# Patient Record
Sex: Male | Born: 2000 | Race: Black or African American | Hispanic: No | Marital: Single | Smoking: Never smoker
Health system: Southern US, Community
[De-identification: ages and names within clinical notes are randomized; demographics above are authoritative.]

## PROBLEM LIST (undated history)

## (undated) ENCOUNTER — Ambulatory Visit: Admission: EM | Payer: Self-pay | Source: Home / Self Care

## (undated) DIAGNOSIS — J45909 Unspecified asthma, uncomplicated: Secondary | ICD-10-CM

## (undated) HISTORY — PX: WRIST SURGERY: SHX841

---
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
## 2018-02-13 ENCOUNTER — Ambulatory Visit: Admission: EM | Admit: 2018-02-13 | Discharge: 2018-02-13 | Discharge status: Against Medical Advice | Payer: Self-pay

## 2018-04-24 ENCOUNTER — Other Ambulatory Visit: Payer: Self-pay

## 2018-04-24 ENCOUNTER — Ambulatory Visit
Admission: EM | Admit: 2018-04-24 | Discharge: 2018-04-24 | Disposition: A | Discharge status: Home / Self Care | Payer: Self-pay | Attending: Family Medicine | Admitting: Family Medicine

## 2018-04-24 ENCOUNTER — Ambulatory Visit (INDEPENDENT_AMBULATORY_CARE_PROVIDER_SITE_OTHER): Payer: Self-pay

## 2018-04-24 ENCOUNTER — Encounter: Payer: Self-pay | Admitting: Emergency Medicine

## 2018-04-24 DIAGNOSIS — M79675 Pain in left toe(s): Secondary | ICD-10-CM

## 2018-04-24 DIAGNOSIS — L608 Other nail disorders: Secondary | ICD-10-CM

## 2018-04-24 DIAGNOSIS — L03032 Cellulitis of left toe: Secondary | ICD-10-CM

## 2018-04-24 MED ORDER — CEPHALEXIN 500 MG PO CAPS: 500.0000 mg | ORAL_CAPSULE | Freq: Three times a day (TID) | ORAL | 0 refills | Status: AC

## 2018-04-24 MED ORDER — MUPIROCIN 2 % EX OINT: TOPICAL_OINTMENT | CUTANEOUS | 0 refills | Status: DC

## 2018-04-24 NOTE — ED Triage Notes (Signed)
Pt c/o left great toe pain. He was practicing basketball last night and noticed his toe was hurting. He thought he just cracked his toe nail but when he woke up this morning his toe was red and swollen.

## 2018-04-24 NOTE — ED Provider Notes (Signed)
[facility] ____________________________________________  Time seen: Approximately 10:22 AM  I have reviewed the triage vital signs and the nursing notes.   HISTORY  Chief Complaint Toe Pain (left great toe)  HPI Arthur Gates is a 17 y.o. male presenting with mother bedside for evaluation of left great toe pain since yesterday.  Patient states that he had an extended vascular practice yesterday and which afterwards he noticed pain to the left great toe.  Also did notice some bleeding along the medial nail.  Denies any purulence.  States the area is very tender, prickly tender to touch.  Denies any known trauma or injury.  Reports tetanus immunization is up-to-date.  No pain radiation, paresthesias or other pain to left foot.  Denies history of similar in the past.  Patient does states that he does sometimes have some tenderness where his foot slides and is since she was but states this is different than that.  States that he felt that this was due to a split toenail.  Did take ibuprofen last night without resolution.  States pain is been a throbbing pain throughout the night.  Denies other relieving factors.  Reports otherwise feels well. Denies recent sickness. Denies recent antibiotic use.  Reports chronic dry skin flares.  [facility]: PCP   Past Medical History:  Diagnosis Date  . Asthma     There are no active problems to display for this patient.   Past Surgical History:  Procedure Laterality Date  . WRIST SURGERY Left      No current facility-administered medications for this encounter.   Current Outpatient Medications:  .  albuterol (PROVENTIL) (2.5 MG/3ML) 0.083% nebulizer solution, Take 2.5 mg by nebulization every 6 (six) hours as needed for wheezing or shortness of breath., Disp: , Rfl:  .  cephALEXin (KEFLEX) 500 MG capsule, Take 1 capsule (500 mg total) by mouth 3 (three) times daily for 7 days., Disp: 21 capsule,  Rfl: 0 .  mupirocin ointment (BACTROBAN) 2 %, Apply two times a day for 7 days., Disp: 22 g, Rfl: 0  Allergies Patient has no known allergies.  History reviewed. No pertinent family history.  Social History Social History   Tobacco Use  . Smoking status: Never Smoker  . Smokeless tobacco: Never Used  Substance Use Topics  . Alcohol use: No    Alcohol/week: 0.0 standard drinks  . Drug use: No    Review of Systems Constitutional: No fever/chills Cardiovascular: Denies chest pain. Respiratory: Denies shortness of breath. Gastrointestinal: No abdominal pain.   Musculoskeletal: Negative for back pain. Skin: as above.   ____________________________________________   PHYSICAL EXAM:  VITAL SIGNS: ED Triage Vitals  Enc Vitals Group     BP 04/24/18 1004 123/81     Pulse Rate 04/24/18 1004 66     Resp 04/24/18 1004 17     Temp 04/24/18 1004 98 F (36.7 C)     Temp Source 04/24/18 1004 Oral     SpO2 04/24/18 1004 100 %     Weight 04/24/18 1003 155 lb (70.3 kg)     Height 04/24/18 1003 5\' 11"  (1.803 m)     Head Circumference --      Peak Flow --      Pain Score 04/24/18 1002 8     Pain Loc --      Pain Edu? --      Excl. in GC? --     Constitutional: Alert and oriented. Well appearing  and in no acute distress. ENT      Head: Normocephalic and atraumatic. Cardiovascular: Normal rate, regular rhythm. Grossly normal heart sounds.  Good peripheral circulation. Respiratory: Normal respiratory effort without tachypnea nor retractions. Breath sounds are clear and equal bilaterally. No wheezes, rales, rhonchi. Musculoskeletal:  Bilateral pedal pulses equal and easily palpated. Neurologic:  Normal speech and language.Speech is normal. No gait instability.  Skin:  Skin is warm, dry. Except: Diffuse dry skin to bilateral feet.  Noted thickened yellowish toenails to both feet.  Left great toenail medial aspect thickened toenail with scant dried blood present, mild surrounding  erythema to distal first left finger moderate tenderness to direct palpation,, full range of motion present, normal distal sensation capillary refill, but otherwise nontender. Psychiatric: Mood and affect are normal. Speech and behavior are normal. Patient exhibits appropriate insight and judgment   ___________________________________________   LABS (all labs ordered are listed, but only abnormal results are displayed)  Labs Reviewed - No data to display RADIOLOGY  Dg Toe Great Left  Result Date: 04/24/2018 CLINICAL DATA:  Left great toe pain for 1 day EXAM: LEFT GREAT TOE COMPARISON:  None. FINDINGS: No fracture or dislocation. No aggressive osseous lesion. Tiny marginal osteophytes along the lateral margin of the first MTP joint without joint space narrowing. No soft tissue abnormality. IMPRESSION: 1.  No acute osseous injury of the left great toe. Electronically Signed   By: Elige Ko   On: 04/24/2018 11:18   ____________________________________________   PROCEDURES Procedures   INITIAL IMPRESSION / ASSESSMENT AND PLAN / ED COURSE  Pertinent labs & imaging results that were available during my care of the patient were reviewed by me and considered in my medical decision making (see chart for details).  Very well-appearing patient.  No acute distress.  Left great toe pain.  Suspect cellulitis due to secondary broken skin.  No known trauma.  Will evaluate x-ray for diffuse tenderness, xray negative.  Reports tetanus immunization is up-to-date.  Also discussed in detail with patient and mother patient with diffuse toenail fungal with thickened skin, recommend further evaluation and follow-up with podiatry, local podiatry information given.  Will treat patient with oral Keflex, topical Bactroban, continue supportive care Tylenol and ibuprofen.  Discussed activity note given.Discussed indication, risks and benefits of medications with patient.  Discussed follow up with Primary care  physician this week. Discussed follow up and return parameters including no resolution or any worsening concerns. Patient verbalized understanding and agreed to plan.   ____________________________________________   FINAL CLINICAL IMPRESSION(S) / ED DIAGNOSES  Final diagnoses:  Great toe pain, left  Cellulitis of left toe     ED Discharge Orders         Ordered    cephALEXin (KEFLEX) 500 MG capsule  3 times daily     04/24/18 1100    mupirocin ointment (BACTROBAN) 2 %     04/24/18 1100           Note: This dictation was prepared with Dragon dictation along with smaller phrase technology. Any transcriptional errors that result from this process are unintentional.         Renford Dills, NP 04/24/18 1142

## 2018-04-24 NOTE — Discharge Instructions (Addendum)
Take medication as prescribed. Rest. Drink plenty of fluids. Keep clean.   Follow-up with podiatry as discussed.  See above to call today to schedule follow-up.  Follow up with your primary care physician this week as needed. Return to Urgent care for new or worsening concerns.

## 2019-03-27 ENCOUNTER — Other Ambulatory Visit: Payer: Self-pay

## 2019-03-27 ENCOUNTER — Encounter: Payer: Self-pay | Admitting: Cardiovascular Disease

## 2019-03-27 ENCOUNTER — Ambulatory Visit (INDEPENDENT_AMBULATORY_CARE_PROVIDER_SITE_OTHER): Payer: Self-pay

## 2019-03-27 ENCOUNTER — Ambulatory Visit (INDEPENDENT_AMBULATORY_CARE_PROVIDER_SITE_OTHER): Payer: Self-pay | Admitting: Cardiovascular Disease

## 2019-03-27 VITALS — BP 114/74 | HR 71 | Ht 72.0 in | Wt 140.0 lb

## 2019-03-27 DIAGNOSIS — R011 Cardiac murmur, unspecified: Secondary | ICD-10-CM

## 2019-03-27 NOTE — Patient Instructions (Signed)
Medication Instructions:  Your physician recommends that you continue on your current medications as directed. Please refer to the Current Medication list given to you today.  If you need a refill on your cardiac medications before your next appointment, please call your pharmacy.   Lab work: None ordered If you have labs (blood work) drawn today and your tests are completely normal, you will receive your results only by: Marland Kitchen MyChart Message (if you have MyChart) OR . A paper copy in the mail If you have any lab test that is abnormal or we need to change your treatment, we will call you to review the results.  Testing/Procedures: Your physician has requested that you have an echocardiogram. Echocardiography is a painless test that uses sound waves to create images of your heart. It provides your doctor with information about the size and shape of your heart and how well your heart's chambers and valves are working. This procedure takes approximately one hour. There are no restrictions for this procedure. (please schedule asap)  Follow-Up: At [facility], you and your health needs are our priority.  As part of our continuing mission to provide you with exceptional heart care, we have created designated Provider Care Teams.  These Care Teams include your primary Cardiologist (physician) and Advanced Practice Providers (APPs -  Physician Assistants and Nurse Practitioners) who all work together to provide you with the care you need, when you need it. You will need a follow up appointment as needed You may see  Dr.Arida or one of the following Advanced Practice Providers on your designated Care Team:   Murray Hodgkins, NP Christell Faith, PA-C . Marrianne Mood, PA-C

## 2019-03-27 NOTE — Progress Notes (Signed)
Cardiology Office Note   Date:  03/27/2019   ID:  Arthur Gates, DOB 05/24/2001, MRN [id number]  PCP:  [facility]  Cardiologist:   Kathlyn Sacramento, MD   Chief Complaint  Patient presents with  . other    Heart murmur no complaints today. Meds reviewed verbally with pt.      History of Present Illness: Arthur Gates is a 18 y.o. male who was referred by urgent care [facility] for evaluation of a heart murmur.  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  He was never told about any heart murmurs growing up.  He has been healthy with mild asthma and otherwise no other medical issues.  He does not smoke or drink alcohol.  He has no family history of heart disease. He is completely asymptomatic even at high level of exercise.  No chest pain, shortness of breath, palpitations, dizziness or syncope.    Past Medical History:  Diagnosis Date  . Asthma     Past Surgical History:  Procedure Laterality Date  . WRIST SURGERY Left      Current Outpatient Medications  Medication Sig Dispense Refill  . albuterol (PROVENTIL) (2.5 MG/3ML) 0.083% nebulizer solution Take 2.5 mg by nebulization every 6 (six) hours as needed for wheezing or shortness of breath.     No current facility-administered medications for this visit.     Allergies:   Patient has no known allergies.    Social History:  The patient  reports that he has never smoked. He has never used smokeless tobacco. He reports that he does not drink alcohol or use drugs.   Family History:  The patient's family history is negative for coronary artery disease, valvular heart disease or arrhythmia.   ROS:  Please see the history of present illness.   Otherwise, review of systems are positive for none.   All other systems are reviewed and negative.    PHYSICAL EXAM: VS:  BP 114/74 (BP Location: Right Arm, Patient Position: Sitting, Cuff Size: Normal)   Pulse 71    Ht 6' (1.829 m)   Wt 140 lb (63.5 kg)   BMI 18.99 kg/m  , BMI Body mass index is 18.99 kg/m. GEN: Well nourished, well developed, in no acute distress  HEENT: normal  Neck: no JVD, carotid bruits, or masses Cardiac: RRR; no  rubs, or gallops,no edema .  1 out of 6 systolic murmur at the apex Respiratory:  clear to auscultation bilaterally, normal work of breathing GI: soft, nontender, nondistended, + BS MS: no deformity or atrophy  Skin: warm and dry, no rash Neuro:  Strength and sensation are intact Psych: euthymic mood, full affect   EKG:  EKG is ordered today. The ekg ordered today demonstrates normal sinus rhythm with nonspecific T wave changes.   Recent Labs: No results found for requested labs within last 8760 hours.    Lipid Panel No results found for: CHOL, TRIG, HDL, CHOLHDL, VLDL, LDLCALC, LDLDIRECT    Wt Readings from Last 3 Encounters:  03/27/19 140 lb (63.5 kg) (25 %, Z= -0.67)*  04/24/18 155 lb (70.3 kg) (55 %, Z= 0.13)*  06/21/16 150 lb (68 kg) (62 %, Z= 0.30)*   * Growth percentiles are based on CDC (Boys, 2-20 Years) data.       PAD Screen 03/27/2019  Previous PAD dx? No  Previous surgical procedure? No  Pain with walking? No  Feet/toe relief with  dangling? No  Painful, non-healing ulcers? No  Extremities discolored? No      ASSESSMENT AND PLAN:  1.  Cardiac murmur: Likely benign but EKG is slightly abnormal with nonspecific T wave changes.  Due to that and given that he plays competitive sports, I requested an echocardiogram for evaluation. The patient has no symptoms at all.   Disposition:   FU with me as needed  Signed,  Lorine BearsMuhammad Arida, MD  03/27/2019 10:26 AM    [facility]

## 2019-03-28 LAB — ECHOCARDIOGRAM COMPLETE
Height: 72 in
Weight: 2240 oz

## 2019-03-31 ENCOUNTER — Telehealth: Payer: Self-pay | Admitting: Cardiovascular Disease

## 2019-03-31 NOTE — Telephone Encounter (Signed)
Patient would like echo results.

## 2019-03-31 NOTE — Telephone Encounter (Signed)
Results called to pt. Pt verbalized understanding.  

## 2021-01-13 ENCOUNTER — Other Ambulatory Visit: Payer: Self-pay

## 2021-01-13 ENCOUNTER — Ambulatory Visit
Admission: EM | Admit: 2021-01-13 | Discharge: 2021-01-13 | Disposition: A | Discharge status: Home / Self Care | Payer: Self-pay

## 2021-02-13 ENCOUNTER — Ambulatory Visit: Payer: Self-pay

## 2021-03-03 ENCOUNTER — Ambulatory Visit: Payer: Self-pay

## 2021-03-03 ENCOUNTER — Other Ambulatory Visit: Payer: Self-pay

## 2021-03-03 DIAGNOSIS — Z130 Encounter for screening for diseases of the blood and blood-forming organs and certain disorders involving the immune mechanism: Secondary | ICD-10-CM

## 2021-03-03 NOTE — Progress Notes (Signed)
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 ROI signed and RN walked pt to lab. [name], RN

## 2021-03-10 ENCOUNTER — Ambulatory Visit (LOCAL_COMMUNITY_HEALTH_CENTER): Payer: Self-pay

## 2021-03-10 ENCOUNTER — Other Ambulatory Visit: Payer: Self-pay

## 2021-03-10 DIAGNOSIS — Z23 Encounter for immunization: Secondary | ICD-10-CM

## 2021-03-10 NOTE — Progress Notes (Signed)
Here for vaccines for college. [facility] review shows limited childhood vaccines. Pt attended [employer] through high school ([employer]). Pt states he did get vaccines from [facility] as a child. RN explained that it would be highly unlikely for pt to attend public school for 12 years without a complete record. RN counseled pt to locate vaccine record from [employer] and [facility]. Counseled pt that once he locates vaccine record, he may schedule imm appt for RN to review and update NCIR. Upon [facility]IR review today, last Tdap 11/2010 and pt is due. Denies having Tdap since then. Tdap administered today and tolerated well. NCIR updated and copy given. Local dental provider resource list given per pt request. [name], RN

## 2021-03-16 ENCOUNTER — Telehealth: Payer: Self-pay

## 2021-03-16 NOTE — Telephone Encounter (Signed)
Phone call to pt to notify him of sickle cell test results which are normal. Results explained. Copy of results placed at [facility] info booth and pt plans to pick up Mon-Fri (8-5). [name], RN

## 2023-03-30 ENCOUNTER — Ambulatory Visit
Admission: EM | Admit: 2023-03-30 | Discharge: 2023-03-30 | Disposition: A | Discharge status: Home / Self Care | Payer: Self-pay | Attending: Internal Medicine | Admitting: Internal Medicine

## 2023-03-30 ENCOUNTER — Encounter: Payer: Self-pay | Admitting: Emergency Medicine

## 2023-03-30 DIAGNOSIS — Z1152 Encounter for screening for COVID-19: Secondary | ICD-10-CM | POA: Insufficient documentation

## 2023-03-30 DIAGNOSIS — J209 Acute bronchitis, unspecified: Secondary | ICD-10-CM | POA: Insufficient documentation

## 2023-03-30 LAB — SARS CORONAVIRUS 2 BY RT PCR: SARS Coronavirus 2 by RT PCR: NEGATIVE

## 2023-03-30 MED ORDER — AZITHROMYCIN 250 MG PO TABS: ORAL_TABLET | ORAL | 0 refills | Status: AC

## 2023-03-30 NOTE — ED Triage Notes (Addendum)
Patient c/o cough, congestion that started 3 days ago.  Patient states that he was seen about 2 weeks ago for chest congestion.  Patient unsure of fevers.  Patient would like a work note.

## 2023-03-30 NOTE — ED Provider Notes (Signed)
[facility]    CSN: [id number] Arrival date & time: 03/30/23  1819      History   Chief Complaint Chief Complaint  Patient presents with   Cough   Nasal Congestion    HPI Arthur Gates is a 22 y.o. male who presents due to having a cough x 12 days. Has had some sweats. His cough has been productive with yellow and green. He used his inhaler last this am. Today he feels a little better, but still feels tightness in his chest which the inhaler helps with. He has not checked his temp at home. Felt he was getting worse 3 days ago. Has been using his inhaler q 4h, and has taken the Prednisone and Tessalon prescribed by [facility] 5 days ago.  He has tried OTC cough meds and allergy meds and the cough has persisted.     Past Medical History:  Diagnosis Date   Asthma     There are no problems to display for this patient.   Past Surgical History:  Procedure Laterality Date   WRIST SURGERY Left        Home Medications    Prior to Admission medications   Medication Sig Start Date End Date Taking? Authorizing Provider  azithromycin (ZITHROMAX Z-PAK) 250 MG tablet 2 today, then one every day x 4 days 03/30/23  Yes Rodriguez-Southworth, Nettie Elm, PA-C  albuterol (PROVENTIL) (2.5 MG/3ML) 0.083% nebulizer solution Take 2.5 mg by nebulization every 6 (six) hours as needed for wheezing or shortness of breath.    [provider]    Family History History reviewed. No pertinent family history.  Social History Social History   Tobacco Use   Smoking status: Never   Smokeless tobacco: Never  Vaping Use   Vaping status: Never Used  Substance Use Topics   Alcohol use: No    Alcohol/week: 0.0 standard drinks of alcohol   Drug use: No     Allergies   Patient has no known allergies.   Review of Systems Review of Systems As noted in HPI  Physical Exam Triage Vital Signs ED Triage Vitals  Encounter Vitals Group     BP 03/30/23 1847 127/81      Systolic BP Percentile --      Diastolic BP Percentile --      Pulse Rate 03/30/23 1847 79     Resp 03/30/23 1847 15     Temp 03/30/23 1847 98.5 F (36.9 C)     Temp Source 03/30/23 1847 Oral     SpO2 03/30/23 1847 98 %     Weight 03/30/23 1846 150 lb (68 kg)     Height 03/30/23 1846 6' (1.829 m)     Head Circumference --      Peak Flow --      Pain Score 03/30/23 1845 0     Pain Loc --      Pain Education --      Exclude from Growth Chart --    No data found.  Updated Vital Signs BP 127/81 (BP Location: Right Arm)   Pulse 79   Temp 98.5 F (36.9 C) (Oral)   Resp 15   Ht 6' (1.829 m)   Wt 150 lb (68 kg)   SpO2 98%   BMI 20.34 kg/m   Visual Acuity Right Eye Distance:   Left Eye Distance:   Bilateral Distance:    Right Eye Near:   Left Eye Near:    Bilateral Near:  Physical Exam Physical Exam Constitutional:      General: He is not in acute distress.    Appearance: He is not toxic-appearing.  HENT:     Head: Normocephalic.     Right Ear: Tympanic membrane, ear canal and external ear normal.     Left Ear: Ear canal and external ear normal.     Nose: Nose normal.     Mouth/Throat:     Mouth: Mucous membranes are moist.     Pharynx: Oropharynx is clear.  Eyes:     General: No scleral icterus.    Conjunctiva/sclera: Conjunctivae normal.  Cardiovascular:     Rate and Rhythm: Normal rate and regular rhythm.     Heart sounds: No murmur heard.   Pulmonary:     Effort: Pulmonary effort is normal. No respiratory distress. Deep breaths provoked a cough.     Breath sounds: clear    Musculoskeletal:        General: Normal range of motion.     Cervical back: Neck supple.  Lymphadenopathy:     Cervical: No cervical adenopathy.  Skin:    General: Skin is warm and dry.     Findings: No rash.  Neurological:     Mental Status: He is alert and oriented to person, place, and time.     Gait: Gait normal.  Psychiatric:        Mood and Affect: Mood normal.         Behavior: Behavior normal.        Thought Content: Thought content normal.        Judgment: Judgment normal.    UC Treatments / Results  Labs (all labs ordered are listed, but only abnormal results are displayed) Labs Reviewed  SARS CORONAVIRUS 2 BY RT PCR    EKG   Radiology No results found.  Procedures Procedures (including critical care time)  Medications Ordered in UC Medications - No data to display  Initial Impression / Assessment and Plan / UC Course  I have reviewed the triage vital signs and the nursing notes.  He declined to have a Chest xray due to not having insurance  Acute bronchitis  I placed him on Zpack as noted and he is to continue his current meds. If he does not improve, will need to get a chest xray and be seen again. He agrees.  Final Clinical Impressions(s) / UC Diagnoses   Final diagnoses:  Acute bronchitis, unspecified organism   Discharge Instructions   None    ED Prescriptions     Medication Sig Dispense Auth. Provider   azithromycin (ZITHROMAX Z-PAK) 250 MG tablet 2 today, then one every day x 4 days 6 tablet Rodriguez-Southworth, Nettie Elm, PA-C      PDMP not reviewed this encounter.   Garey Ham, [place] 03/30/23 1943

## 2023-06-04 ENCOUNTER — Encounter (HOSPITAL_BASED_OUTPATIENT_CLINIC_OR_DEPARTMENT_OTHER): Payer: Self-pay

## 2023-06-04 ENCOUNTER — Emergency Department (HOSPITAL_BASED_OUTPATIENT_CLINIC_OR_DEPARTMENT_OTHER)
Admission: EM | Admit: 2023-06-04 | Discharge: 2023-06-04 | Disposition: A | Discharge status: Home / Self Care | Payer: No Typology Code available for payment source | Attending: Emergency Medicine | Admitting: Emergency Medicine

## 2023-06-04 ENCOUNTER — Other Ambulatory Visit: Payer: Self-pay

## 2023-06-04 DIAGNOSIS — R42 Dizziness and giddiness: Secondary | ICD-10-CM | POA: Insufficient documentation

## 2023-06-04 DIAGNOSIS — R112 Nausea with vomiting, unspecified: Secondary | ICD-10-CM | POA: Insufficient documentation

## 2023-06-04 DIAGNOSIS — Z5321 Procedure and treatment not carried out due to patient leaving prior to being seen by health care provider: Secondary | ICD-10-CM | POA: Insufficient documentation

## 2023-06-04 DIAGNOSIS — R519 Headache, unspecified: Secondary | ICD-10-CM | POA: Diagnosis present
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
